# Patient Record
Sex: Male | Born: 1980 | Hispanic: Yes | Marital: Married | Smoking: Never smoker
Health system: Southern US, Community
[De-identification: ages and names within clinical notes are randomized; demographics above are authoritative.]

## PROBLEM LIST (undated history)

## (undated) DIAGNOSIS — I10 Essential (primary) hypertension: Secondary | ICD-10-CM

## (undated) DIAGNOSIS — E7800 Pure hypercholesterolemia, unspecified: Secondary | ICD-10-CM

---

## 2016-09-22 ENCOUNTER — Encounter (INDEPENDENT_AMBULATORY_CARE_PROVIDER_SITE_OTHER): Payer: Self-pay | Admitting: Orthopedic Surgery

## 2016-09-22 ENCOUNTER — Ambulatory Visit (INDEPENDENT_AMBULATORY_CARE_PROVIDER_SITE_OTHER): Payer: Worker's Compensation | Admitting: Orthopedic Surgery

## 2016-09-22 ENCOUNTER — Ambulatory Visit (INDEPENDENT_AMBULATORY_CARE_PROVIDER_SITE_OTHER): Payer: Self-pay

## 2016-09-22 DIAGNOSIS — M7711 Lateral epicondylitis, right elbow: Secondary | ICD-10-CM

## 2016-09-22 NOTE — Progress Notes (Signed)
Office Visit Note   Patient: Jason Shannon           Date of Birth: [birth year]           MRN: [id number] Visit Date: 09/22/2016 Requested by: No referring provider defined for this encounter. PCP: No primary care provider on file.  Subjective: Chief Complaint  Patient presents with  . Right Elbow - Pain    HPI Zetta BillsGonzalo is a 36 year old right-hand-dominant patient here for evaluation of right elbow pain.  Multiple old records are reviewed as part of this evaluation.  In review those records it appears that Mr. Jayme CloudGonzalez was working in [employer] and he injured his right elbow doing repetitive heavy lifting 03/30/2015.  When I discussed this with the patient it appears that he was lifting bags and throwing them full of yard waste in a repetitive manner and he described the onset of pain in the lateral aspect of his right arm.  He was evaluated 04/28/2015 and diagnosed with right elbow acute traumatic lateral epicondylitis.  Radial grafts at that time were normal and I reviewed his radiographs as well and do not see any spurring noted on either the medial or lateral epicondyles.  Injection was performed along with stretching exercises and anti-inflammatory.  He was placed on light duty.  He had a good response to that injection and was reevaluated 05/19/2015 and he is cleared for regular duty work at that time.  He was wearing his elbow brace.  At that time it was believed that he had reached maximal medical improvement.  He was seen approximately 3 months later with recurrent right lateral elbow pain.  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  MRI scan was recommended at that time due to failure of conservative treatment.  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  That does require some use of his right arm.  Currently he describes pain localizing to the lateral condyle with occasional radiation  down the forearm region.  Most of his symptoms are activity related and he does not have much in terms of symptoms at rest.  The pain does not wake him from sleep.  He's currently not taking any medications for the problem.  He denies any neck pain.  scan and it was fairly unremarkable.  Not as much tendinosis changes within the common extensor origin as I would anticipate based on his symptoms.  Rest of the elbow MRI scan was normal.  The patient was seen back in February 2017 where repeat injection and further therapy was initiated.  A month after that injection he is making slow improvement.  He was continuing to take anti-inflammatories orally as well as topically.  He is continuing to use his elbow brace.  He is placed back in a regular duty work for [id number].  He was released from care 36 with maximal medical improvement and 0% PPI.  Since that time he has              Review of Systems All systems reviewed are negative as they relate to the chief complaint within the history of present illness.  Patient denies  fevers or chills.    Assessment & Plan: Visit Diagnoses:  1. 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  I repeated radiographs today because on ultrasound examination it appeared that  he may have developed small spur on the lateral aspect of the epicondyle.  That is not confirmed on the repeat radiographs.  He is having some tenderness in the radial tunnel which is asymmetric right versus left.  In terms of recommendation for further treatment and management I would say for completeness a nerve study to evaluate for overt radial tunnel syndrome is likely indicated.  He still is having great 6 out of 10 pain in the lateral aspect of his elbow with essentially normal MRI scan iand2 injections a long course of physical therapy nonsteroidals and topical anti-inflammatories and  bracing.  That nerve conduction study if positive would be confirmatory but oftentimes patients who have mild radial tunnel syndrome will not necessarily have findings on their EMG nerve studies.  Nevertheless, If his nerve study is negative I agree with the current rating and release.  I think this would be diagnosed then as refractory tennis elbow which according to the literature should resolve over time.  I don't think there is any further intervention that can be done other than waiting it out..  If the nerve study does show slowing across that radial tunnel especially around the supinator then consideration may be given towards decompression.  Typically my experience with patients with radial tunnel syndrome is they  have both activity-related pain as well as rest pain.  At this point I agree with continuing regular duty work.  I agree with the current rating unless new information comes to light.  I'll see him back as needed    Follow-Up Instructions: No Follow-up on file.   Orders:  Orders Placed This Encounter  Procedures  . XR Elbow 2 Views Right   No orders of the defined types were placed in this encounter.     Procedures: No procedures performed   Clinical Data: No additional findings.  Objective: Vital Signs: There were no vitals taken for this visit.  Physical Exam   Constitutional: Patient appears well-developed HEENT:  Head: Normocephalic Eyes:EOM are normal Neck: Normal range of motion Cardiovascular: Normal rate Pulmonary/chest: Effort normal Neurologic: Patient is alert Skin: Skin is warm Psychiatric: Patient has normal mood and affect    Ortho Exam orthopedic exam demonstrates good range of motion of the cervical spine with flexion extension and rotation.  Patient has 5 out of 5 grip EPL FPL interosseous wrist flexion and wrist extension biceps triceps and deltoid strength.  No definite paresthesias in the radial median and ulnar distribution.  Negative  Tinel's cubital tunnel and no subluxation of the ulnar nerve on the right-hand side.  Right-sided elbow range of motion is full.  He does have mild tenderness over the mobile wad right versus left.  Does have some pain with resisted supination right versus left but no real increased asymmetric pain with resisted finger extension right versus left.  Does have pain localizing to the lateral epicondyle with resisted wrist extension on the right-hand side.  He is tender to palpation over the lateral epicondyle on the right and not on the left.  Specialty Comments:  No specialty comments available.  Imaging: No results found.   PMFS History: Patient Active Problem List   Diagnosis Date Noted  . Lateral epicondylitis, right elbow 09/22/2016   No past medical history on file.  No family history on file.  No past surgical history on file. Social History   Occupational History  . Not on file.   Social History Main Topics  . Smoking status: Never Smoker  .  Smokeless tobacco: Never Used  . Alcohol use Not on file  . Drug use: Unknown  . Sexual activity: Not on file

## 2016-12-21 NOTE — Progress Notes (Signed)
Duplicate encounter

## 2019-04-01 ENCOUNTER — Emergency Department (HOSPITAL_COMMUNITY): Payer: Medicaid Other

## 2019-04-01 ENCOUNTER — Encounter (HOSPITAL_COMMUNITY): Payer: Self-pay | Admitting: Emergency Medicine

## 2019-04-01 ENCOUNTER — Other Ambulatory Visit: Payer: Self-pay

## 2019-04-01 ENCOUNTER — Emergency Department (HOSPITAL_COMMUNITY)
Admission: EM | Admit: 2019-04-01 | Discharge: 2019-04-02 | Disposition: A | Discharge status: Home / Self Care | Payer: Medicaid Other | Attending: Emergency Medicine | Admitting: Emergency Medicine

## 2019-04-01 DIAGNOSIS — Y939 Activity, unspecified: Secondary | ICD-10-CM | POA: Diagnosis not present

## 2019-04-01 DIAGNOSIS — Z79899 Other long term (current) drug therapy: Secondary | ICD-10-CM | POA: Diagnosis not present

## 2019-04-01 DIAGNOSIS — S67193A Crushing injury of left middle finger, initial encounter: Secondary | ICD-10-CM | POA: Diagnosis present

## 2019-04-01 DIAGNOSIS — I10 Essential (primary) hypertension: Secondary | ICD-10-CM | POA: Insufficient documentation

## 2019-04-01 DIAGNOSIS — S61313A Laceration without foreign body of left middle finger with damage to nail, initial encounter: Secondary | ICD-10-CM | POA: Insufficient documentation

## 2019-04-01 DIAGNOSIS — Z23 Encounter for immunization: Secondary | ICD-10-CM | POA: Insufficient documentation

## 2019-04-01 DIAGNOSIS — Y999 Unspecified external cause status: Secondary | ICD-10-CM | POA: Diagnosis not present

## 2019-04-01 DIAGNOSIS — W230XXA Caught, crushed, jammed, or pinched between moving objects, initial encounter: Secondary | ICD-10-CM | POA: Insufficient documentation

## 2019-04-01 DIAGNOSIS — S6992XA Unspecified injury of left wrist, hand and finger(s), initial encounter: Secondary | ICD-10-CM

## 2019-04-01 DIAGNOSIS — S62635A Displaced fracture of distal phalanx of left ring finger, initial encounter for closed fracture: Secondary | ICD-10-CM | POA: Diagnosis not present

## 2019-04-01 DIAGNOSIS — Y929 Unspecified place or not applicable: Secondary | ICD-10-CM | POA: Insufficient documentation

## 2019-04-01 DIAGNOSIS — S6990XA Unspecified injury of unspecified wrist, hand and finger(s), initial encounter: Secondary | ICD-10-CM

## 2019-04-01 HISTORY — DX: Pure hypercholesterolemia, unspecified: E78.00

## 2019-04-01 HISTORY — DX: Essential (primary) hypertension: I10

## 2019-04-01 LAB — BASIC METABOLIC PANEL
Anion gap: 12 (ref 5–15)
BUN: 15 mg/dL (ref 6–20)
CO2: 25 mmol/L (ref 22–32)
Calcium: 9.2 mg/dL (ref 8.9–10.3)
Chloride: 103 mmol/L (ref 98–111)
Creatinine, Ser: 1.17 mg/dL (ref 0.61–1.24)
GFR calc Af Amer: >60 mL/min (ref >60)
GFR calc non Af Amer: >60 mL/min (ref >60)
Glucose, Bld: 101 mg/dL — ABNORMAL HIGH (ref 70–99)
Potassium: 3.4 mmol/L — ABNORMAL LOW (ref 3.5–5.1)
Sodium: 140 mmol/L (ref 135–145)

## 2019-04-01 LAB — CBC WITH DIFFERENTIAL/PLATELET
Abs Immature Granulocytes: 0.03 10*3/uL (ref 0.00–0.07)
Basophils Absolute: 0.1 10*3/uL (ref 0.0–0.1)
Basophils Relative: 1 %
Eosinophils Absolute: 0.1 10*3/uL (ref 0.0–0.5)
Eosinophils Relative: 1 %
HCT: 41.1 % (ref 39.0–52.0)
Hemoglobin: 14.2 g/dL (ref 13.0–17.0)
Immature Granulocytes: 0 %
Lymphocytes Relative: 19 %
Lymphs Abs: 2.1 10*3/uL (ref 0.7–4.0)
MCH: 29.6 pg (ref 26.0–34.0)
MCHC: 34.5 g/dL (ref 30.0–36.0)
MCV: 85.8 fL (ref 80.0–100.0)
Monocytes Absolute: 0.7 10*3/uL (ref 0.1–1.0)
Monocytes Relative: 6 %
Neutro Abs: 7.8 10*3/uL — ABNORMAL HIGH (ref 1.7–7.7)
Neutrophils Relative %: 73 %
Platelets: 284 10*3/uL (ref 150–400)
RBC: 4.79 MIL/uL (ref 4.22–5.81)
RDW: 12.5 % (ref 11.5–15.5)
WBC: 10.7 10*3/uL — ABNORMAL HIGH (ref 4.0–10.5)
nRBC: 0 % (ref 0.0–0.2)

## 2019-04-01 MED ORDER — LIDOCAINE HCL (PF) 1 % IJ SOLN
30.0000 mL | Freq: Once | INTRAMUSCULAR | Status: AC
  Administered 2019-04-01: 30 mL
  Filled 2019-04-01: qty 30

## 2019-04-01 MED ORDER — MORPHINE SULFATE (PF) 4 MG/ML IV SOLN
4.0000 mg | Freq: Once | INTRAVENOUS | Status: AC
  Administered 2019-04-01: 23:00:00 4 mg via INTRAVENOUS
  Filled 2019-04-01: qty 1

## 2019-04-01 MED ORDER — GENTAMICIN SULFATE 40 MG/ML IJ SOLN
1.5000 mg/kg | Freq: Once | INTRAVENOUS | Status: AC
  Administered 2019-04-01: 160 mg via INTRAVENOUS
  Filled 2019-04-01: qty 4

## 2019-04-01 MED ORDER — ONDANSETRON HCL 4 MG/2ML IJ SOLN
4.0000 mg | Freq: Once | INTRAMUSCULAR | Status: AC
  Administered 2019-04-01: 4 mg via INTRAVENOUS
  Filled 2019-04-01: qty 2

## 2019-04-01 MED ORDER — TETANUS-DIPHTH-ACELL PERTUSSIS 5-2.5-18.5 LF-MCG/0.5 IM SUSP
0.5000 mL | Freq: Once | INTRAMUSCULAR | Status: AC
  Administered 2019-04-01: 0.5 mL via INTRAMUSCULAR
  Filled 2019-04-01: qty 0.5

## 2019-04-01 MED ORDER — CEFAZOLIN SODIUM-DEXTROSE 1-4 GM/50ML-% IV SOLN
1.0000 g | Freq: Once | INTRAVENOUS | Status: AC
  Administered 2019-04-01: 1 g via INTRAVENOUS
  Filled 2019-04-01: qty 50

## 2019-04-01 MED ORDER — HYDROCODONE-ACETAMINOPHEN 5-325 MG PO TABS
2.0000 | ORAL_TABLET | Freq: Once | ORAL | Status: AC
  Administered 2019-04-01: 2 via ORAL
  Filled 2019-04-01: qty 2

## 2019-04-01 NOTE — Discharge Instructions (Addendum)
Keep the finger clean and dry and until you see Dr. Lenon Curt in the morning.

## 2019-04-01 NOTE — ED Provider Notes (Signed)
[facility]   CSN: [id number] Arrival date & time: 04/01/19  2026     History   Chief Complaint Chief Complaint  Patient presents with  . Hand Injury    finger degloving    HPI Jason Shannon is a 38 y.o. male.     Patient presents to the emergency department with a chief complaint of finger injury.  He states that he smashed his finger between a shed and a trailer.  He transports the shelves for living.  He sustained a severe traumatic injury to the distal portion of his left middle finger.  Last tetanus shot unknown.  Bleeding controlled with gauze.  Pain is moderate.  No treatments prior to arrival.  Last tetanus shot unknown.  The history is provided by the patient. No language interpreter was used.    Past Medical History:  Diagnosis Date  . High cholesterol   . Hypertension     Patient Active Problem List   Diagnosis Date Noted  . Lateral epicondylitis, right elbow 09/22/2016    History reviewed. No pertinent surgical history.      Home Medications    Prior to Admission medications   Medication Sig Start Date End Date Taking? Authorizing Provider  atorvastatin (LIPITOR) 40 MG tablet Take 40 mg by mouth at bedtime.  07/27/16  Yes [provider]  budesonide-formoterol (SYMBICORT) 160-4.5 MCG/ACT inhaler Inhale 2 puffs into the lungs as needed for shortness of breath. 11/09/16  Yes [provider]  clonazePAM (KLONOPIN) 0.5 MG tablet Take 0.5 mg by mouth 2 (two) times daily as needed for anxiety.  07/28/16  Yes [provider]  ibuprofen (ADVIL) 800 MG tablet Take 800 mg by mouth every 8 (eight) hours as needed for moderate pain.   Yes [provider]  lisinopril-hydrochlorothiazide (PRINZIDE,ZESTORETIC) 10-12.5 MG tablet Take 1 tablet by mouth daily.  08/31/16  Yes [provider]    Family History No family history on file.  Social History Social History    Tobacco Use  . Smoking status: Never Smoker  . Smokeless tobacco: Never Used  Substance Use Topics  . Alcohol use: Not on file  . Drug use: Not on file     Allergies   Patient has no known allergies.   Review of Systems Review of Systems  All other systems reviewed and are negative.    Physical Exam Updated Vital Signs BP (!) 166/100 (BP Location: Right Arm)   Pulse 86   Temp 98.7 F (37.1 C) (Oral)   Resp 18   SpO2 97%   Physical Exam Nursing note and vitals reviewed.  Constitutional: Pt appears well-developed and well-nourished. No distress.  HENT:  Head: Normocephalic and atraumatic.  Eyes: Conjunctivae are normal.  Neck: Normal range of motion.  Cardiovascular: Normal rate, regular rhythm. Intact distal pulses.   Capillary refill < 3 sec.  Pulmonary/Chest: Effort normal and breath sounds normal.  Musculoskeletal:  Left middle finger Pt exhibits traumatic injury with partial degloving and amputation of the distal finger.   ROM: deferred  Strength: deferred  Neurological: Pt  is alert. Coordination normal.  Sensation: intact Skin: Skin is warm and dry. Pt is not diaphoretic.  As pictured Psychiatric: Pt has a normal mood and affect.             ED Treatments / Results  Labs (all labs ordered are listed, but only abnormal results are displayed) Labs Reviewed - No data to display  EKG None  Radiology Dg Finger Middle Left  Result Date: 04/01/2019 CLINICAL DATA:  Finger injury EXAM: LEFT MIDDLE FINGER 2+V COMPARISON:  None. FINDINGS: Acute comminuted fracture involving the tuft of the third distal phalanx with 3 mm separation of tuft fracture fragment from parent bone. Associated soft tissue deformity consistent with open wound. Lateral view demonstrates amorphous opacity along the volar aspect of the distal phalanx. IMPRESSION: 1. Acute comminuted displaced and open fracture involving tuft of the third distal phalanx. 2. Amorphous opacity along  the volar aspect of the distal phalanx on the lateral view, indeterminate for foreign body or additional fracture fragments. Electronically Signed   By: Donavan Foil M.D.   On: 04/01/2019 23:00   Dg Finger Ring Left  Result Date: 04/01/2019 CLINICAL DATA:  Finger injury EXAM: LEFT RING FINGER 2+V COMPARISON:  04/01/2019 FINDINGS: Acute comminuted and displaced fracture tuft of the third distal phalanx with overlying soft tissue injury. Acute comminuted and mildly displaced fracture involving the tuft of the fourth distal phalanx. No subluxation. No radiopaque foreign body. IMPRESSION: 1. Acute mildly comminuted fracture involving the tuft of the fourth distal phalanx 2. Acute comminuted and displaced fracture involving the tuft of the third distal phalanx with probable additional fracture fragments along the volar aspect of the distal phalanx. Electronically Signed   By: Donavan Foil M.D.   On: 04/01/2019 23:17    Procedures Procedures (including critical care time)  Medications Ordered in ED Medications  gentamicin (GARAMYCIN) 1.5 mg/kg in dextrose 5 % 50 mL IVPB (has no administration in time range)  ceFAZolin (ANCEF) IVPB 1 g/50 mL premix (has no administration in time range)  Tdap (BOOSTRIX) injection 0.5 mL (has no administration in time range)  morphine 4 MG/ML injection 4 mg (has no administration in time range)  ondansetron (ZOFRAN) injection 4 mg (has no administration in time range)     Initial Impression / Assessment and Plan / ED Course  I have reviewed the triage vital signs and the nursing notes.  Pertinent labs & imaging results that were available during my care of the patient were reviewed by me and considered in my medical decision making (see chart for details).        Patient with traumatic injury to the left middle finger.  Partial degloving/amputation.  X-ray shows open fracture of the left third distal tuft and mildly comminuted fracture of the fourth left distal  tuft.    Will start IV abx.  Will consult hand surgery.  Wound was very dirty from grease and grime.  This was cleaned with betadine, saline, scrubbing, and irrigated.  Case discussed with Dr. Lenon Curt from hand surgery.  Discussed in detail the extent of the injury.  He recommends a dose of IV antibiotics and discharge at this time.  He will see the patient at his office in the morning.    Final Clinical Impressions(s) / ED Diagnoses   Final diagnoses:  Finger injury    ED Discharge Orders    None       Montine Circle, PA-C 04/01/19 [id number]    Varney Biles, MD 04/02/19 [phone]

## 2019-04-01 NOTE — ED Triage Notes (Addendum)
Pt reports he was at work when his L middle finger got stuck under a shed that he was moving. Tip of finger was degloved during accident. Bleeding controlled at this time. Pt also reports a laceration to the L ring finger as well.

## 2019-04-02 NOTE — ED Notes (Signed)
Patient verbalizes understanding of discharge instructions. Opportunity for questioning and answers were provided. Armband removed by staff, pt discharged from ED ambulatory.   

## 2019-04-02 NOTE — ED Notes (Signed)
Pt's wound irrigated and new dressing applied. Wound care taught to pt. Pt verbalized understanding.

## 2020-01-01 IMAGING — DX LEFT RING FINGER 2+V
3 series · 3 of 3 positions shown · non-contrast
Comparison: 04/01/2019

CLINICAL DATA: Finger injury

EXAM:
LEFT RING FINGER 2+V

[finger ap]
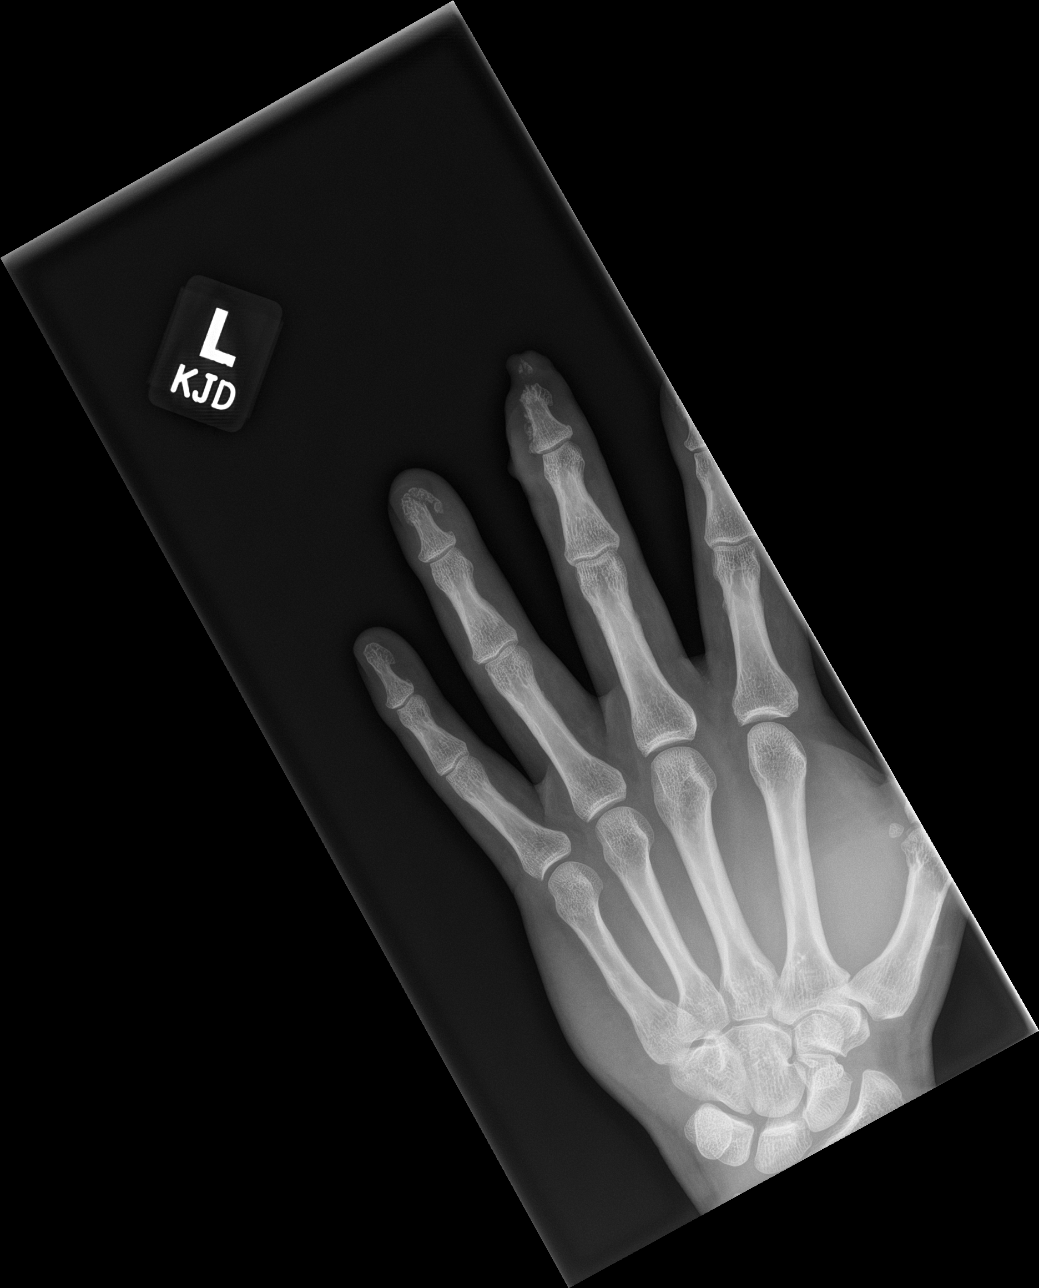

[finger obl]
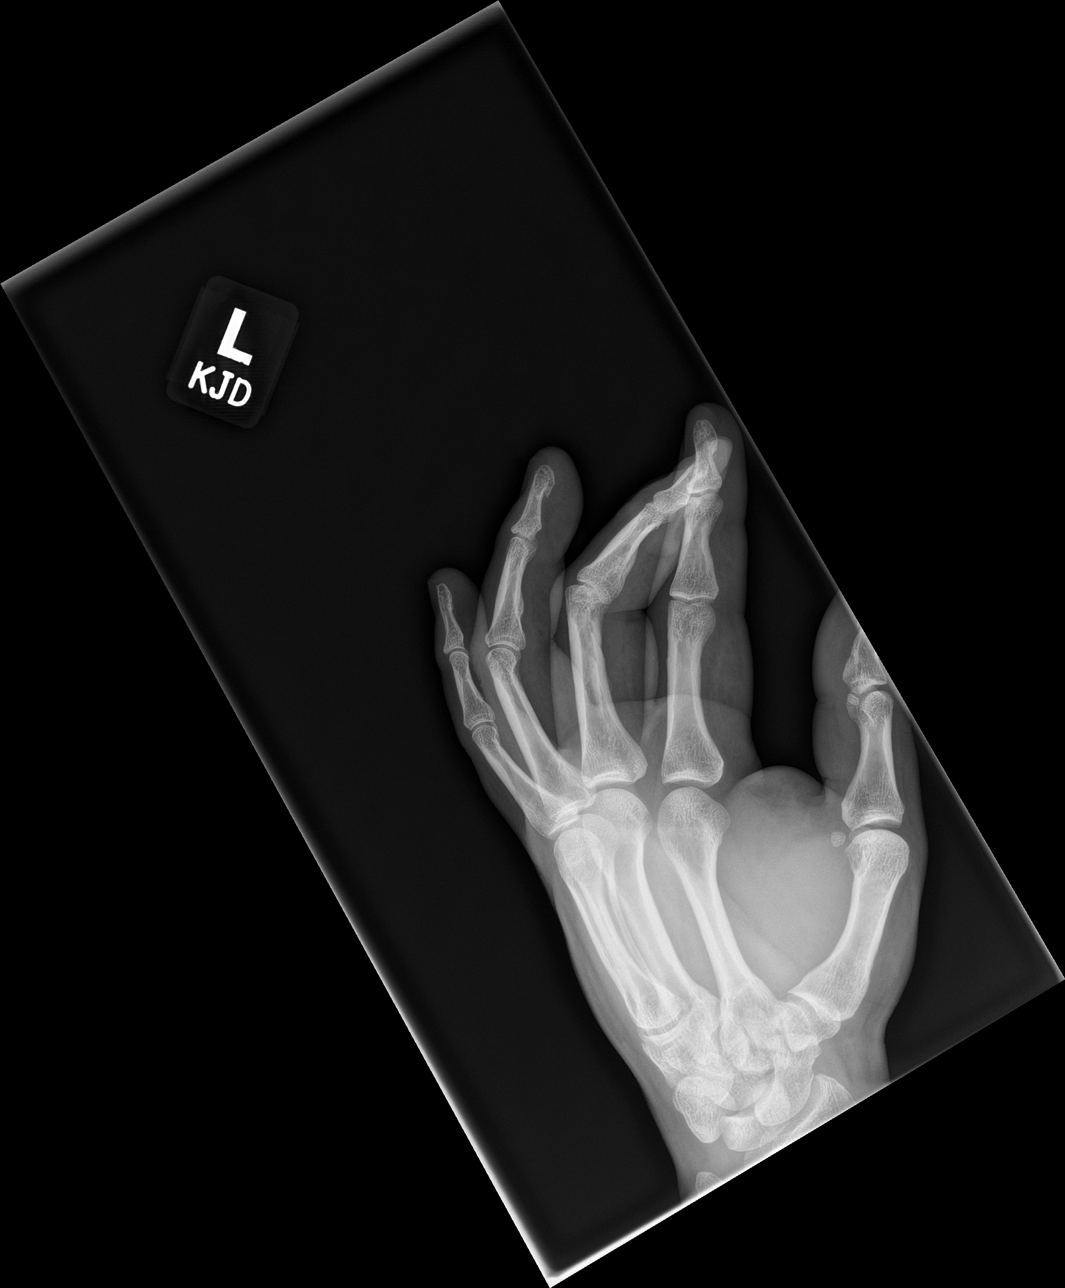

[finger lat]
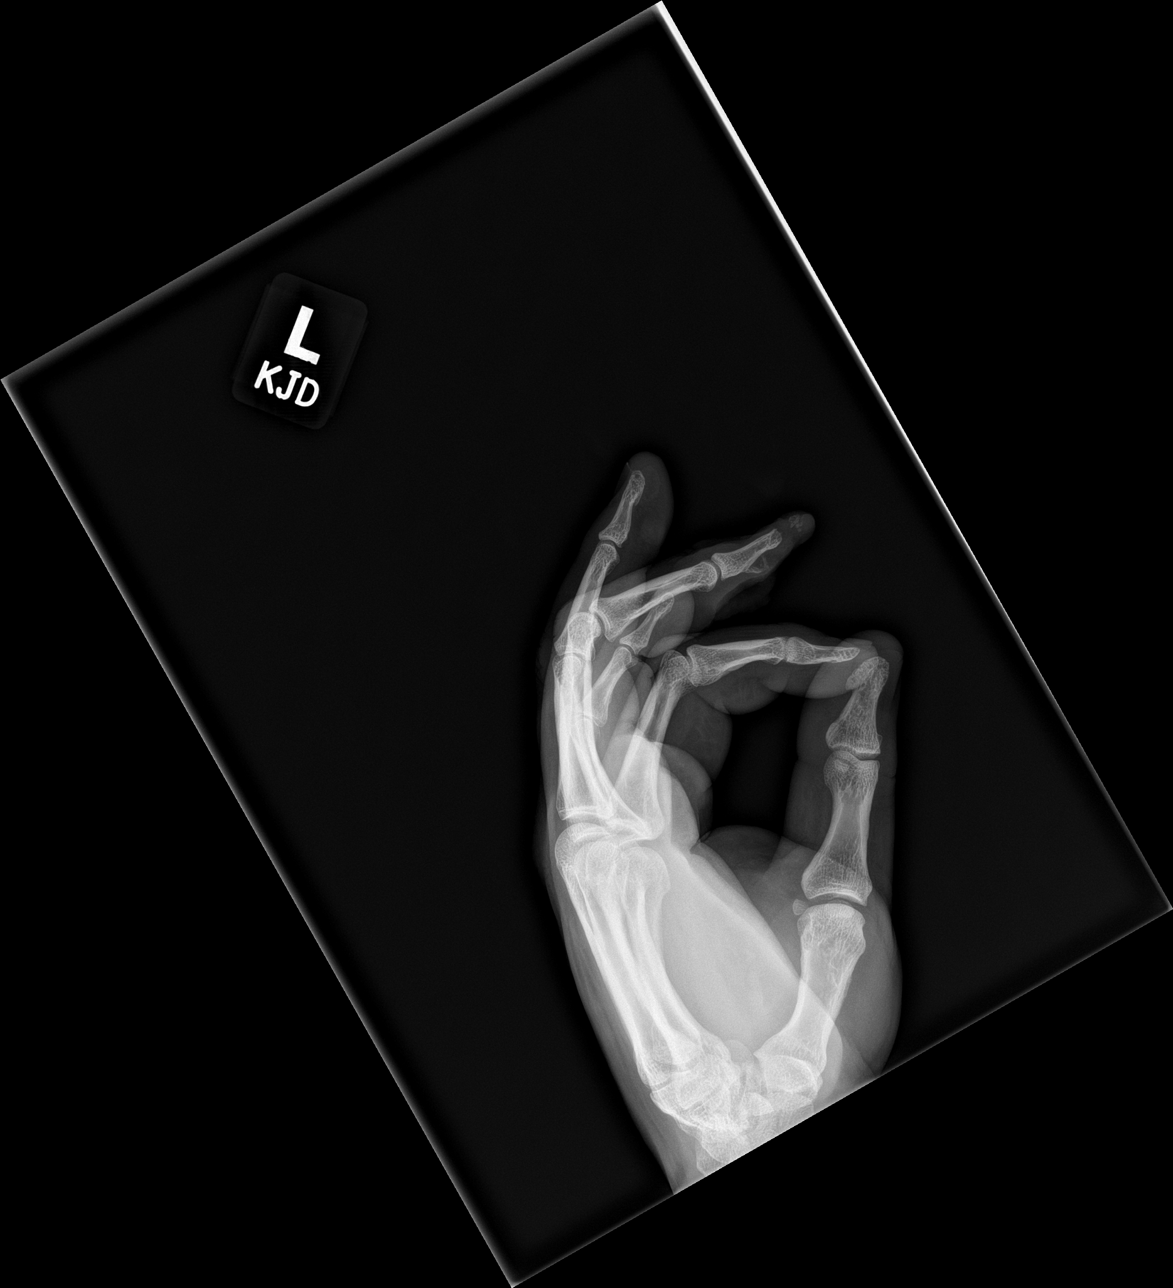

[3 of 3 positions shown; findings below may reference images not displayed]

FINDINGS: Acute comminuted and displaced fracture tuft of the third distal
phalanx with overlying soft tissue injury. Acute comminuted and
mildly displaced fracture involving the tuft of the fourth distal
phalanx. No subluxation. No radiopaque foreign body.
IMPRESSION: 1. Acute mildly comminuted fracture involving the tuft of the fourth
distal phalanx
2. Acute comminuted and displaced fracture involving the tuft of the
third distal phalanx with probable additional fracture fragments
along the volar aspect of the distal phalanx.
